# Patient Record
Sex: Female | Born: 1935 | Race: White | Hispanic: No
Health system: Southern US, Community
[De-identification: ages and names within clinical notes are randomized; demographics above are authoritative.]

---

## 2005-06-26 ENCOUNTER — Ambulatory Visit: Payer: Self-pay | Admitting: Internal Medicine

## 2005-06-27 ENCOUNTER — Encounter: Payer: Self-pay | Admitting: Internal Medicine

## 2005-06-28 ENCOUNTER — Ambulatory Visit (HOSPITAL_COMMUNITY): Admission: RE | Admit: 2005-06-28 | Discharge: 2005-06-28 | Payer: Self-pay | Admitting: Internal Medicine

## 2005-07-04 ENCOUNTER — Ambulatory Visit: Payer: Self-pay | Admitting: Internal Medicine

## 2007-05-15 IMAGING — PT NM PET TUM IMG SKULL BASE T - THIGH
1 of 4 series · 1 of 25 positions shown · non-contrast
Comparison: None available.

CLINICAL DATA: Pancreatic cancer seen on an outside CT scan.  
 FDG PET-CT TUMOR IMAGING (SKULL BASE TO THIGHS):

 Fasting Blood Glucose:  11
TECHNIQUE: 17.7 mCi F-18 FDG were administered via left antecubital site.  Full ring PET imaging was performed from the skull base through the mid-thighs 70 minutes after injection.  CT data was obtained and used for attenuation correction and anatomic localization only.  (This was not acquired as a diagnostic CT examination.)

[Series 2: ct images · axial · 3.8mm · 0.98mm/px · 1 of 267 slices shown]
[im 267/267  brain]
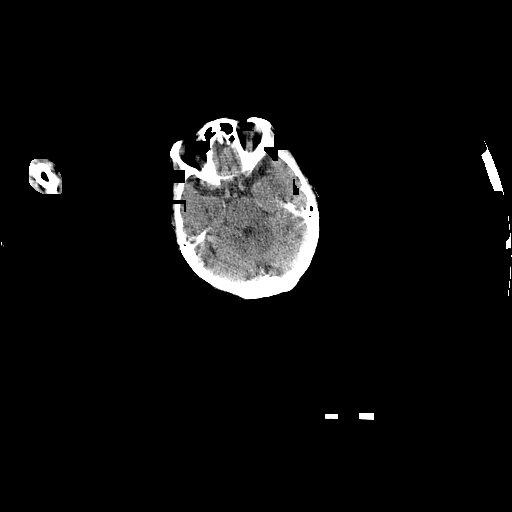

[1 of 25 positions shown; findings below may reference images not displayed]

FINDINGS: PET imaging shows diffuse abnormal FDG accumulation in the lower neck, chest, abdomen and upper anatomic pelvis.  There is left cervical adenopathy.  Left subpectoral and axillary adenopathy is noted with an index left subpectoral node having an SUV of 9.4.  Mediastinal and left hilar adenopathy shows abnormal FDG uptake.  There is circumferential uptake in the pleura bilaterally with patchy areas of uptake identified in the parenchyma of both lungs. There is retrocrural adenopathy with abnormal FDG accumulation.
 In the abdomen, there is bulky adenopathy in the root of the mesentery with SUV range as high as 11. 
 A large necrotic lesion is identified in the posterior right liver with an SUV of 4.5.  Retroperitoneal and periaortic adenopathy extends into the upper anatomic pelvis with abnormal accumulation of radiotracer within these nodes. 
 CT obtained for attenuation correction demonstrates enlargement of the ascending aorta up to 4 cm, consistent with mild aneurysmal dilatation.  The lungs show diffuse interstitial and nodular opacity in all lobes of both lungs worrisome for substantial pulmonary metastatic involvement.  There are bilateral pleural effusions. 
 The large right liver lesion is low in attenuation, compatible with a necrosis.  There is a dominant mass in the body of the pancreas with an atrophic pancreatic tail demonstrating pancreatic ductal dilatation.  Bilateral kidney stones are non-obstructing.  There is free fluid in the anatomic pelvis with a cystic area in the posterolateral left anatomic pelvis that does not demonstrate increased FDG uptake.
IMPRESSION: 1.  Malignant range FDG accumulation in the region of the pancreatic mass with metastatic lymphadenopathy in the lower left neck, chest, abdomen and upper anatomic pelvis. 
 2.  Diffuse interstitial and nodular change throughout all lobes of both lungs suggests a prominent burden of metastatic involvement.  There are associated small to moderate bilateral pleural effusions. 
 3.  Single dominant liver lesion compatible with metastatic disease.

## 2016-08-21 ENCOUNTER — Telehealth: Payer: Self-pay | Admitting: Rheumatology

## 2016-08-21 NOTE — Telephone Encounter (Signed)
Patient left message inquiring about her MTX rx. Patient states she has been out for a month. Please call to advise patient.

## 2016-08-21 NOTE — Telephone Encounter (Signed)
Message entered in error. Incorrect patient.
